# Patient Record
Sex: Female | Born: 1935 | Race: White | Hispanic: No | Smoking: Never smoker
Health system: Southern US, Community
[De-identification: ages and names within clinical notes are randomized; demographics above are authoritative.]

## PROBLEM LIST (undated history)

## (undated) DIAGNOSIS — I10 Essential (primary) hypertension: Secondary | ICD-10-CM

## (undated) DIAGNOSIS — F0390 Unspecified dementia without behavioral disturbance: Secondary | ICD-10-CM

## (undated) DIAGNOSIS — C801 Malignant (primary) neoplasm, unspecified: Secondary | ICD-10-CM

## (undated) DIAGNOSIS — E079 Disorder of thyroid, unspecified: Secondary | ICD-10-CM

## (undated) DIAGNOSIS — M1990 Unspecified osteoarthritis, unspecified site: Secondary | ICD-10-CM

## (undated) HISTORY — PX: ABDOMINAL HYSTERECTOMY: SHX81

## (undated) HISTORY — PX: CHOLECYSTECTOMY: SHX55

---

## 2001-08-24 ENCOUNTER — Ambulatory Visit (HOSPITAL_BASED_OUTPATIENT_CLINIC_OR_DEPARTMENT_OTHER): Admission: RE | Admit: 2001-08-24 | Discharge: 2001-08-24 | Payer: Self-pay | Admitting: General Surgery

## 2002-10-21 ENCOUNTER — Encounter: Payer: Self-pay | Admitting: Emergency Medicine

## 2002-10-21 ENCOUNTER — Emergency Department (HOSPITAL_COMMUNITY): Admission: EM | Admit: 2002-10-21 | Discharge: 2002-10-21 | Payer: Self-pay | Admitting: Emergency Medicine

## 2002-12-10 ENCOUNTER — Encounter: Payer: Self-pay | Admitting: General Surgery

## 2002-12-12 ENCOUNTER — Encounter: Payer: Self-pay | Admitting: General Surgery

## 2002-12-12 ENCOUNTER — Encounter (INDEPENDENT_AMBULATORY_CARE_PROVIDER_SITE_OTHER): Payer: Self-pay | Admitting: *Deleted

## 2002-12-12 ENCOUNTER — Observation Stay (HOSPITAL_COMMUNITY): Admission: RE | Admit: 2002-12-12 | Discharge: 2002-12-13 | Payer: Self-pay | Admitting: General Surgery

## 2006-05-03 ENCOUNTER — Other Ambulatory Visit: Admission: RE | Admit: 2006-05-03 | Discharge: 2006-05-03 | Payer: Self-pay | Admitting: Family Medicine

## 2006-05-19 ENCOUNTER — Encounter: Admission: RE | Admit: 2006-05-19 | Discharge: 2006-05-19 | Payer: Self-pay | Admitting: Family Medicine

## 2007-12-24 ENCOUNTER — Emergency Department (HOSPITAL_COMMUNITY): Admission: EM | Admit: 2007-12-24 | Discharge: 2007-12-24 | Payer: Self-pay | Admitting: Emergency Medicine

## 2007-12-27 ENCOUNTER — Encounter: Admission: RE | Admit: 2007-12-27 | Discharge: 2008-01-29 | Payer: Self-pay | Admitting: Family Medicine

## 2009-02-03 IMAGING — CT CT CERVICAL SPINE W/O CM
5 series · 16 of 33 positions shown, 18 images · IV contrast (agent unspecified)
Comparison: None.

CLINICAL DATA: 74 year-old fell.
 HEAD CT WITHOUT CONTRAST:
TECHNIQUE: Contiguous axial images were obtained from the base of the skull through the vertex according to standard protocol without contrast.
TECHNIQUE: Multidetector CT imaging of the cervical spine was performed.  Multiplanar CT image reconstructions were also generated.

[Series 2: cervical spine · axial · 0.27mm/px · z∈[-160,-97]mm · 2 of 77 slices shown]
[im 26/77  bone]
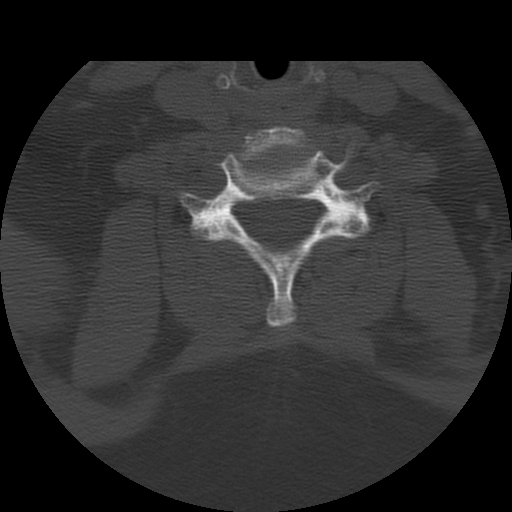
[im 51/77  bone]
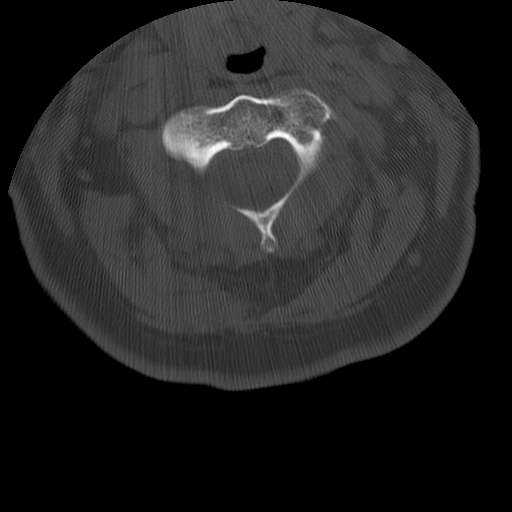

[Series 3: recon 2: cervical spine · axial · 0.27mm/px · z∈[-177,-80]mm · 3 of 77 slices shown, 4 images]
[im 20/77  soft-tissue]
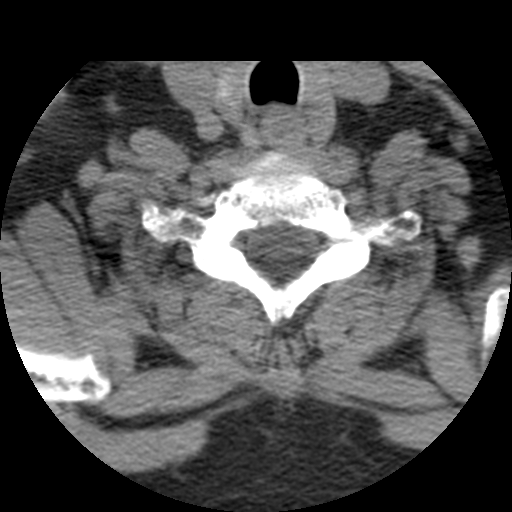
[im 20/77  bone]
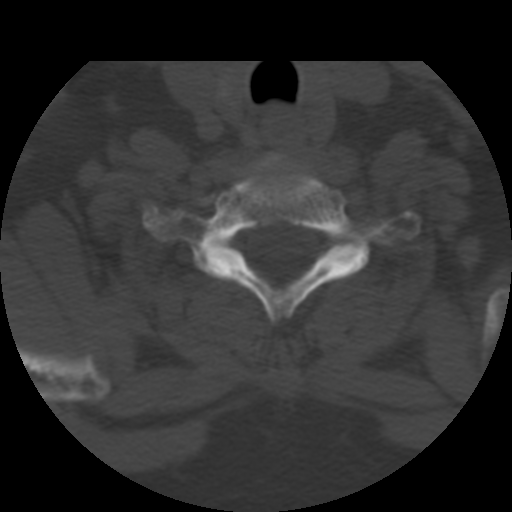
[im 39/77  bone]
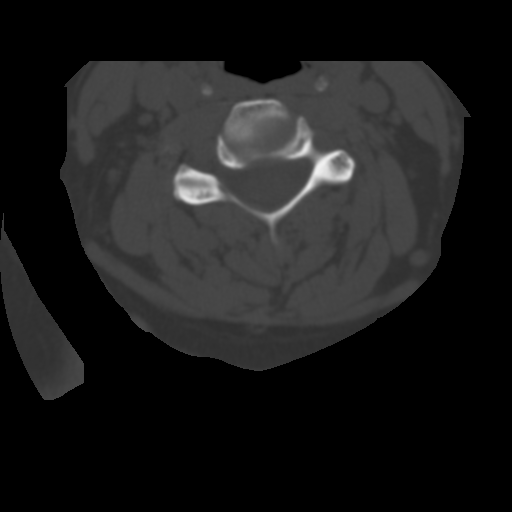
[im 58/77  bone]
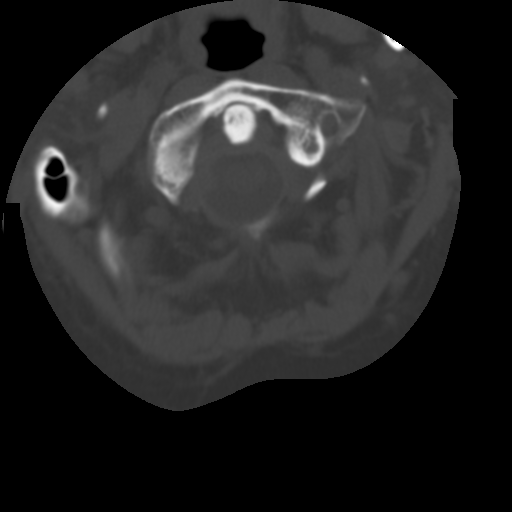

[Series 400: cor c-spine · coronal · 0.37mm/px · 3 of 42 slices shown]
[im 9/42  bone]
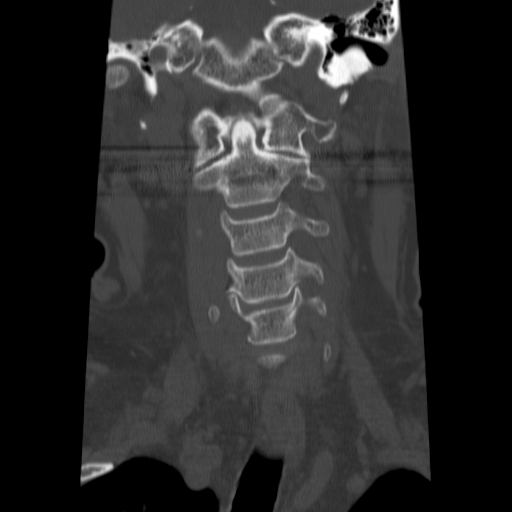
[im 17/42  bone]
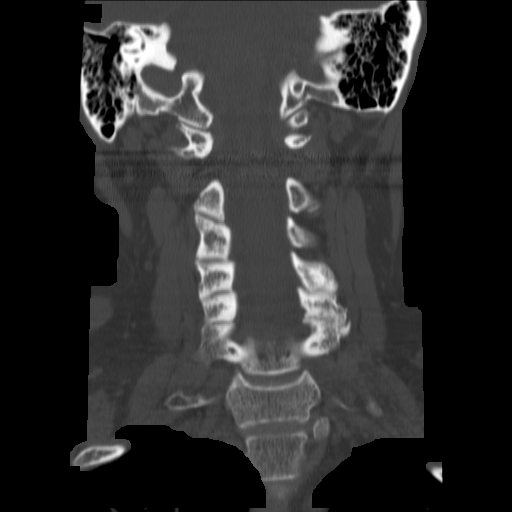
[im 25/42  bone]
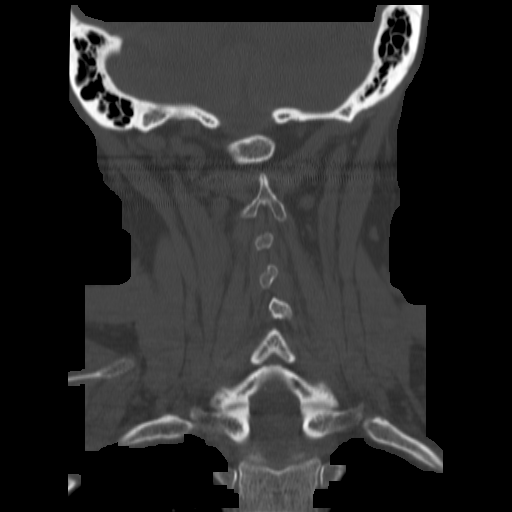

[Series 401: true axials c-spine · axial · 0.37mm/px · z∈[-199,-121]mm · 3 of 82 slices shown]
[im 21/82  bone]
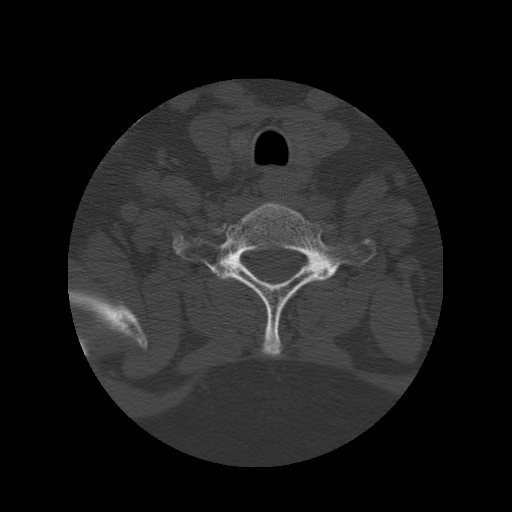
[im 41/82  bone]
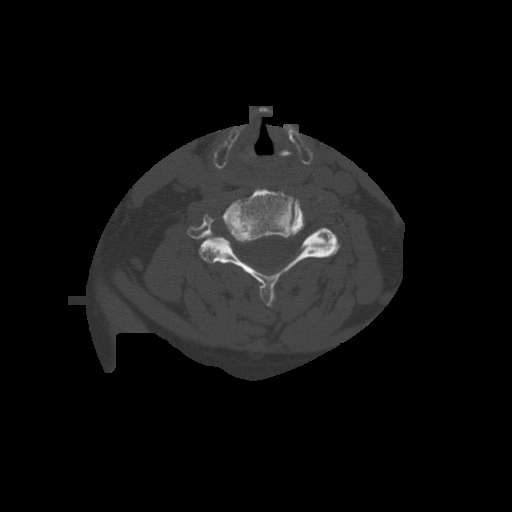
[im 61/82  bone]
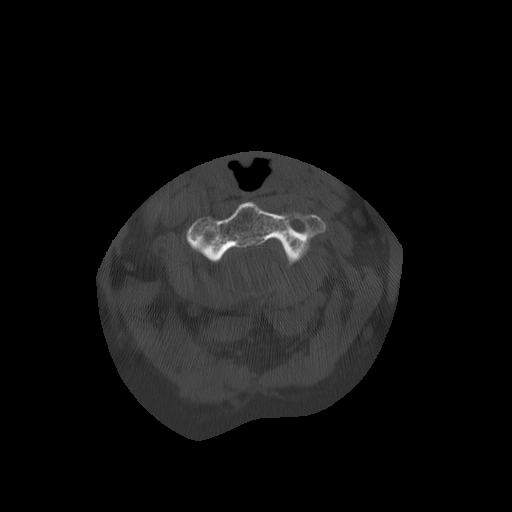

[Series 402: sag c-spine · sagittal · 0.37mm/px · 5 of 41 slices shown, 6 images]
[im 14/41  bone]
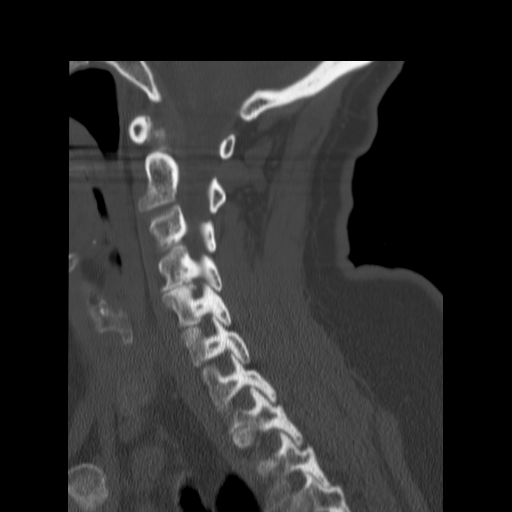
[im 17/41  bone]
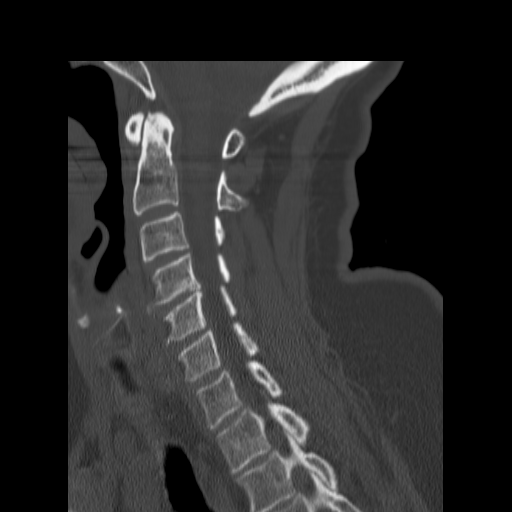
[im 21/41  soft-tissue]
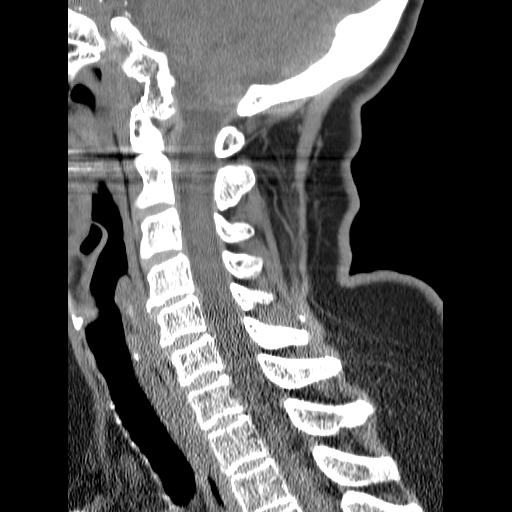
[im 21/41  bone]
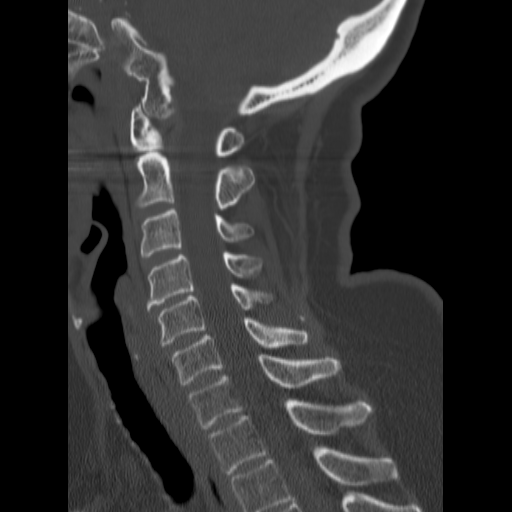
[im 24/41  bone]
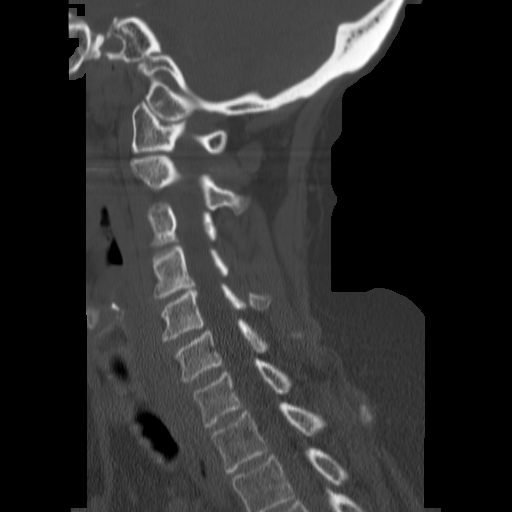
[im 27/41  bone]
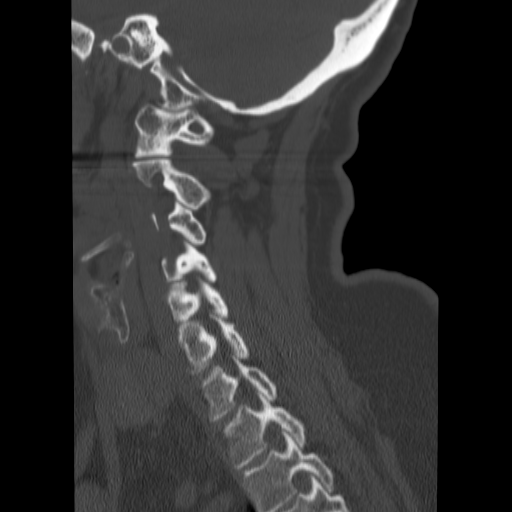

[16 of 33 positions shown; findings below may reference images not displayed]

FINDINGS: The ventricles are in the midline without mass effect or shift.  They are normal in size and configuration.  No extraaxial fluid collections are seen. No acute intracranial findings and no intracranial mass lesions.
 There are a few tiny benign appearing bilateral basal ganglia calcifications and tiny remote lacunar infarct on the left in the basal ganglia. No significant periventricular white matter disease. The brainstem and cerebellum are grossly normal. The bony calvarium is intact.  No skull fracture. There is a left high parietal scalp hematoma.  The paranasal sinuses and mastoid air cells are clear.
IMPRESSION: 1.  No CT evidence for acute intracranial abnormality and no intracranial mass lesions.
 2.  Left high parietal scalp hematoma but no underlying skull fracture.
 CERVICAL SPINE CT WITHOUT CONTRAST:
FINDINGS: The sagittal reformatted images demonstrate normal alignment of the cervical vertebral bodies.  Mild degenerative cervical spondylosis with degenerative disk disease and degenerative facet disease.  No definite fractures are seen.  No abnormal prevertebral soft tissue swelling. No facet or laminar fracture.
 The skull base, C-1, and C1-C2 articulations are normal. The dens appears normal. No evidence for acute disk herniation.  There is mild multilevel foraminal narrowing due to uncinate spurring and facet disease. No epidural hematoma.
IMPRESSION: 1.  Normal alignment. No acute bony findings.
 2.  Degenerative cervical spondylosis with degenerative disease and degenerative facet disease.

## 2009-12-18 ENCOUNTER — Emergency Department (HOSPITAL_COMMUNITY): Admission: EM | Admit: 2009-12-18 | Discharge: 2009-12-18 | Payer: Self-pay | Admitting: Emergency Medicine

## 2010-10-12 ENCOUNTER — Emergency Department (HOSPITAL_COMMUNITY)
Admission: EM | Admit: 2010-10-12 | Discharge: 2010-10-12 | Payer: Self-pay | Source: Home / Self Care | Admitting: Emergency Medicine

## 2010-11-21 ENCOUNTER — Encounter: Payer: Self-pay | Admitting: Emergency Medicine

## 2011-01-11 LAB — DIFFERENTIAL
Basophils Absolute: 0 10*3/uL (ref 0.0–0.1)
Eosinophils Relative: 3 % (ref 0–5)
Lymphs Abs: 2.2 10*3/uL (ref 0.7–4.0)
Monocytes Absolute: 0.5 10*3/uL (ref 0.1–1.0)
Neutro Abs: 3.9 10*3/uL (ref 1.7–7.7)
Neutrophils Relative %: 58 % (ref 43–77)

## 2011-01-11 LAB — BASIC METABOLIC PANEL
BUN: 13 mg/dL (ref 6–23)
CO2: 26 mEq/L (ref 19–32)
GFR calc Af Amer: >60 mL/min (ref >60)
Glucose, Bld: 94 mg/dL (ref 70–99)
Potassium: 3.4 mEq/L — ABNORMAL LOW (ref 3.5–5.1)
Sodium: 141 mEq/L (ref 135–145)

## 2011-01-11 LAB — CBC
HCT: 38.7 % (ref 36.0–46.0)
Hemoglobin: 13.1 g/dL (ref 12.0–15.0)
MCV: 94.9 fL (ref 78.0–100.0)
Platelets: 252 10*3/uL (ref 150–400)
RBC: 4.08 MIL/uL (ref 3.87–5.11)
RDW: 13.2 % (ref 11.5–15.5)
WBC: 6.7 10*3/uL (ref 4.0–10.5)

## 2011-01-20 LAB — COMPREHENSIVE METABOLIC PANEL
ALT: 28 U/L (ref 0–35)
Albumin: 4.2 g/dL (ref 3.5–5.2)
BUN: 15 mg/dL (ref 6–23)
CO2: 23 mEq/L (ref 19–32)
Chloride: 107 mEq/L (ref 96–112)
Glucose, Bld: 106 mg/dL — ABNORMAL HIGH (ref 70–99)

## 2011-01-20 LAB — POCT CARDIAC MARKERS
CKMB, poc: 1.1 ng/mL (ref 1.0–8.0)
Troponin i, poc: <0.05 ng/mL (ref 0.00–0.09)

## 2011-03-08 ENCOUNTER — Other Ambulatory Visit: Payer: Self-pay | Admitting: Dermatology

## 2011-03-19 NOTE — Op Note (Signed)
[name], [name]                       ACCOUNT NO.:  [id number]   MEDICAL RECORD NO.:  [id number]                   PATIENT TYPE:  AMB   LOCATION:  DAY                                  FACILITY:  [facility]   PHYSICIAN:  Angelia Mould. Derrell Lolling, M.D.             DATE OF BIRTH:  Sep 05, 1933   DATE OF PROCEDURE:  12/12/2002  DATE OF DISCHARGE:                                 OPERATIVE REPORT   PREOPERATIVE DIAGNOSIS:  Chronic cholecystitis with cholelithiasis.   POSTOPERATIVE DIAGNOSIS:  Severe chronic cholecystitis with cholelithiasis.   PROCEDURE:  Laparoscopic cholecystectomy with intraoperative cholangiogram.   SURGEON:  Angelia Mould. Derrell Lolling, M.D.   ASSISTANT:  Anselm Pancoast. Zachery Dakins, M.D.   OPERATIVE INDICATION:  This is a 77 year old white female who has had an  abdominal pain syndrome for three or four months.  This started out in the  left lower quadrant, and she had a CT scan and colonoscopy which was all  basically normal.  The pain became more epigastric and postprandial in  nature, and she had a gallbladder ultrasound which showed multiple  gallstones which almost completely filled the gallbladder.  Her white blood  cell count is normal.  Her liver function tests are normal.  She is brought  to the operating room electively.   OPERATIVE FINDINGS:  The gallbladder was severely inflamed.  It was markedly  thick-walled with significant chronic inflammation and the appearance  consistent with acute inflammation.  The common bile duct was high-riding  and had to be carefully dissected off of the infundibulum of the  gallbladder.  The anatomy of the cystic duct, common bile duct, and common  hepatic duct were conventional.  The cholangiogram was normal, showing  normal intrahepatic and extrahepatic bile ducts, prompt flow of contrast  into the duodenum, and no filling defect.  The liver appeared healthy.  The  stomach and duodenum, large intestine and small intestine were  visually  normal.   DESCRIPTION OF PROCEDURE:  Following the induction of general endotracheal  anesthesia, the patient's abdomen was prepped and draped in a sterile  fashion.  Marcaine 0.5% with epinephrine was used as a local infiltration  anesthetic.  A vertically-oriented incision was made inside the lower rim of  the umbilicus.  The fascia was incised in the midline, the abdominal cavity  entered under direct vision.  A 10 mm Hasson trocar was then inserted and  secured with a pursestring suture of 0 Vicryl.  Pneumoperitoneum was  created.  The video camera was inserted with visualization and findings as  described above.  A 10 mm trocar was placed in the subxiphoid region and two  5 mm trocars placed in the right midabdomen.   We placed the camera in the epigastric port and looked at the umbilical port  and found that there were a few omental adhesions around the umbilicus but  no intestinal adhesions.   The gallbladder fundus was identified  and grasped.  It was very thick-walled  and difficult to grasp.  The chronic and somewhat acute adhesions were  carefully teased off of the gallbladder until we could dissect down and  identify the lower body of the gallbladder and something of the infundibulum  of the gallbladder.  We very carefully dissected the duodenum away from the  lower gallbladder.  There was no fistula or inflammation of the duodenum.   We had to spend some time dissecting what appeared to be a somewhat dilated  common bile duct and common hepatic duct away from the lower body of the  gallbladder, but we ultimately were able to dissect this away and get around  the cystic duct.  A metal clip was placed on the cystic duct close to the  gallbladder.  A cholangiogram catheter was inserted into the cystic duct.  A  cholangiogram was obtained using a C-arm.  We visualized normal intrahepatic  and normal extrahepatic bile ducts, no filling defects, and prompt flow of   contrast into the duodenum.  The cholangiogram catheter was removed and the  cystic duct was secured with multiple metal clips and divided.  The  gallbladder was carefully dissected off from its bed.  The cystic artery was  isolated with metal clips and divided as it went onto the gallbladder wall.  We could very carefully continue the dissection until we had removed the  gallbladder.  Some turbid bile was spilled and was suctioned away.  The  gallbladder was placed in a specimen bag and removed through the umbilical  port.   The operative field was copiously irrigated with probably as much as 3 L of  saline during the case.  At the completion of the case there was no bleeding  and no bile leak whatsoever, and the irrigation fluid was completely clear.  The trocars were removed under direct vision, and there was no bleeding from  the trocar sites.  The pneumoperitoneum was released.  The fascia at the  umbilicus was closed with 0 Vicryl sutures.  Skin incisions were closed with  subcuticular sutures of 4-0 Vicryl and Steri-Strips.  Clean bandages were  placed and the patient taken to the recovery room in stable condition.  Estimated blood loss was about 30-40 mL.  Complications:  None.  Sponge,  needle, and instrument counts were correct.                                               Angelia Mould. Derrell Lolling, M.D.    HMI/MEDQ  D:  12/12/2002  T:  12/12/2002  Job:  [id number]   cc:   Dellis Anes. Idell Pickles, M.D.  [address]  [place]  [place] [address]  Fax: [phone]   Llana Aliment. Malon Kindle., M.D.  [address]  [place]  [place] [address]  Fax: [phone]

## 2011-06-15 ENCOUNTER — Other Ambulatory Visit: Payer: Self-pay | Admitting: Family Medicine

## 2011-06-15 DIAGNOSIS — R296 Repeated falls: Secondary | ICD-10-CM

## 2011-07-06 ENCOUNTER — Ambulatory Visit
Admission: RE | Admit: 2011-07-06 | Discharge: 2011-07-06 | Disposition: A | Discharge status: Home / Self Care | Payer: Medicare Other | Source: Ambulatory Visit | Attending: Family Medicine | Admitting: Family Medicine

## 2011-07-06 ENCOUNTER — Other Ambulatory Visit: Payer: Self-pay

## 2011-07-06 DIAGNOSIS — R296 Repeated falls: Secondary | ICD-10-CM

## 2011-07-26 LAB — CBC
HCT: 40
MCHC: 33.8
MCV: 93.4
Platelets: 250
RBC: 4.28
RDW: 12.9

## 2011-07-26 LAB — BASIC METABOLIC PANEL
CO2: 25
Chloride: 105
Creatinine, Ser: 0.74

## 2011-11-23 IMAGING — CR DG HIP COMPLETE 2+V*R*
3 series · 3 of 3 positions shown · non-contrast
Comparison: None

CLINICAL DATA: Fell.  Right hip pain.

RIGHT HIP - COMPLETE 2+ VIEW

[t pelvis a.p.]
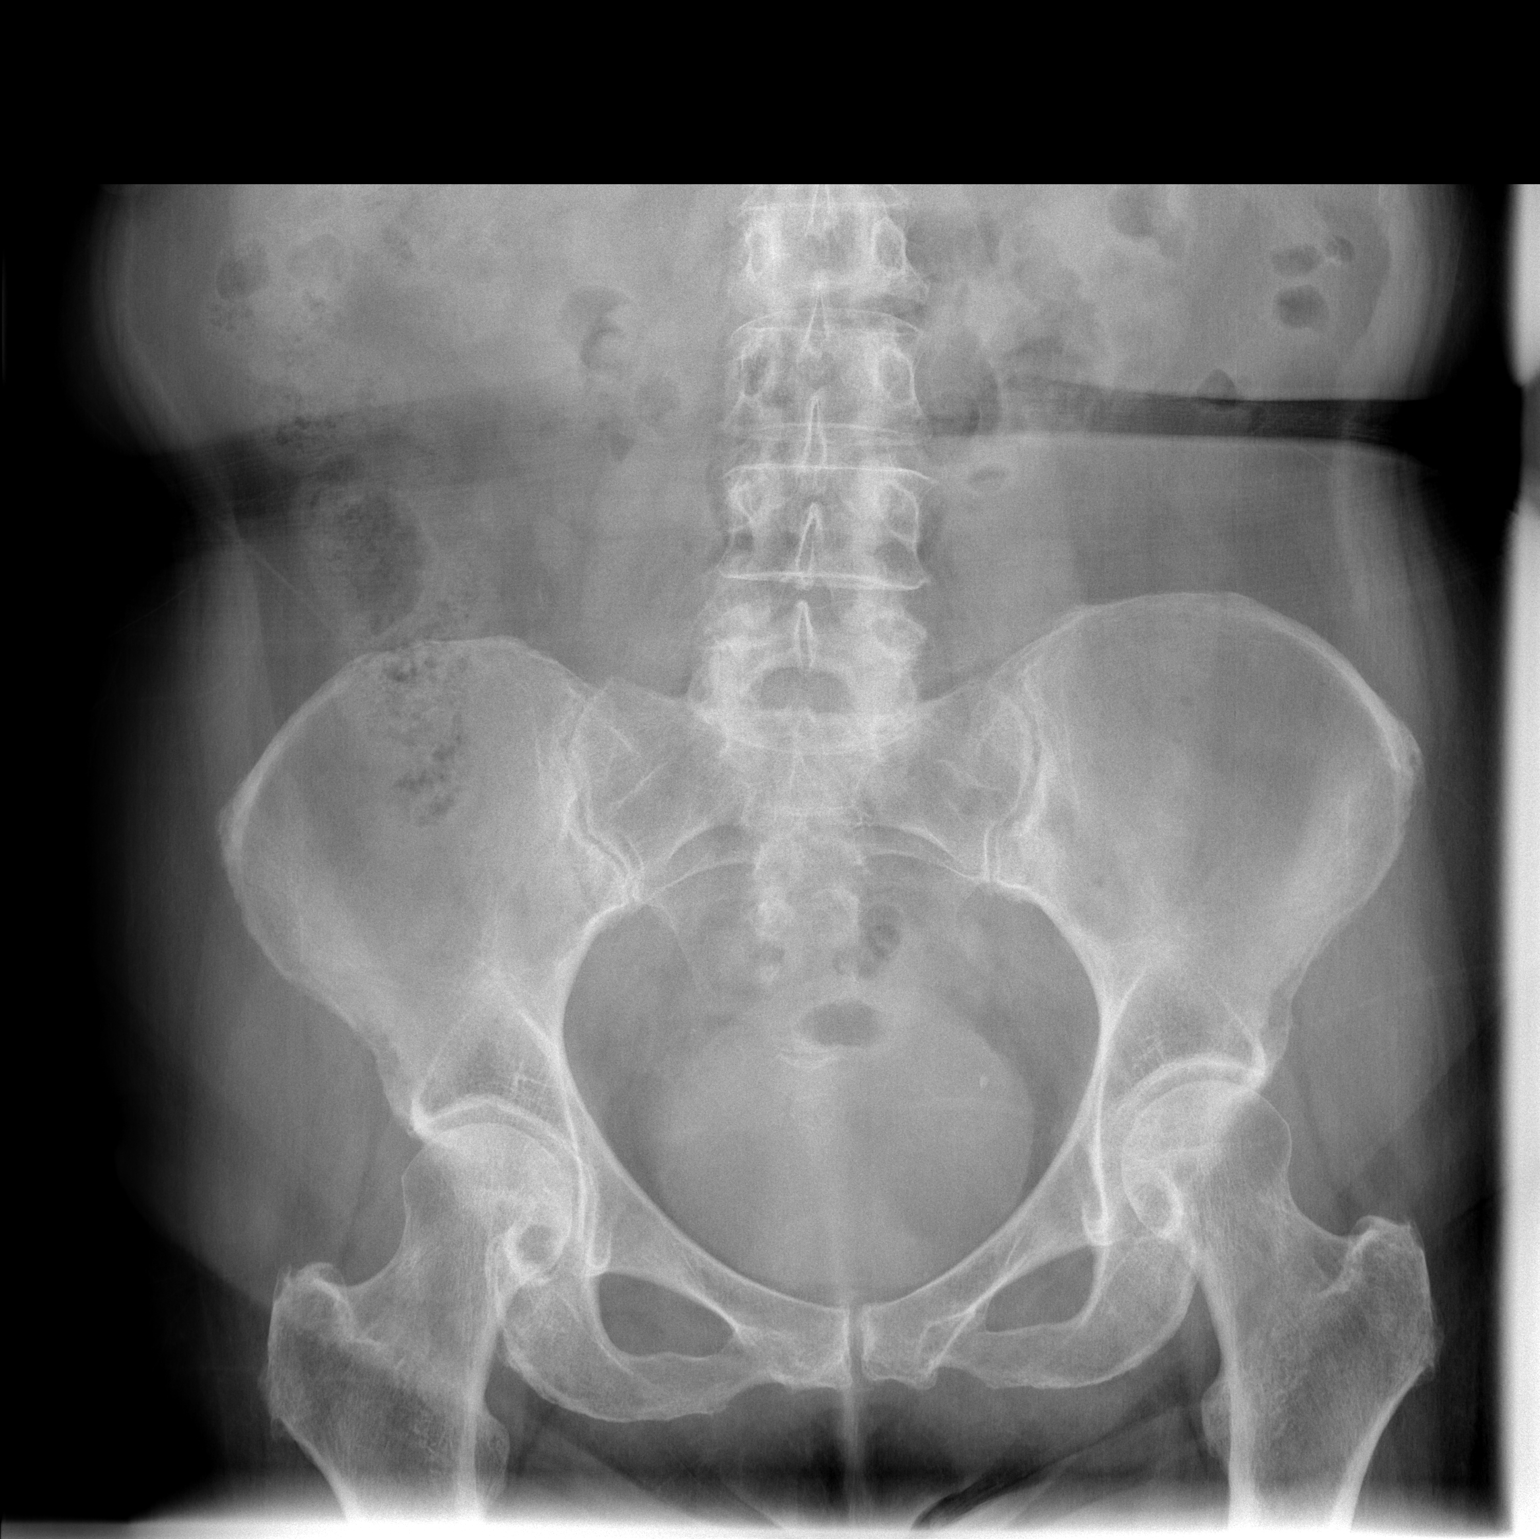

[t hip ap right]
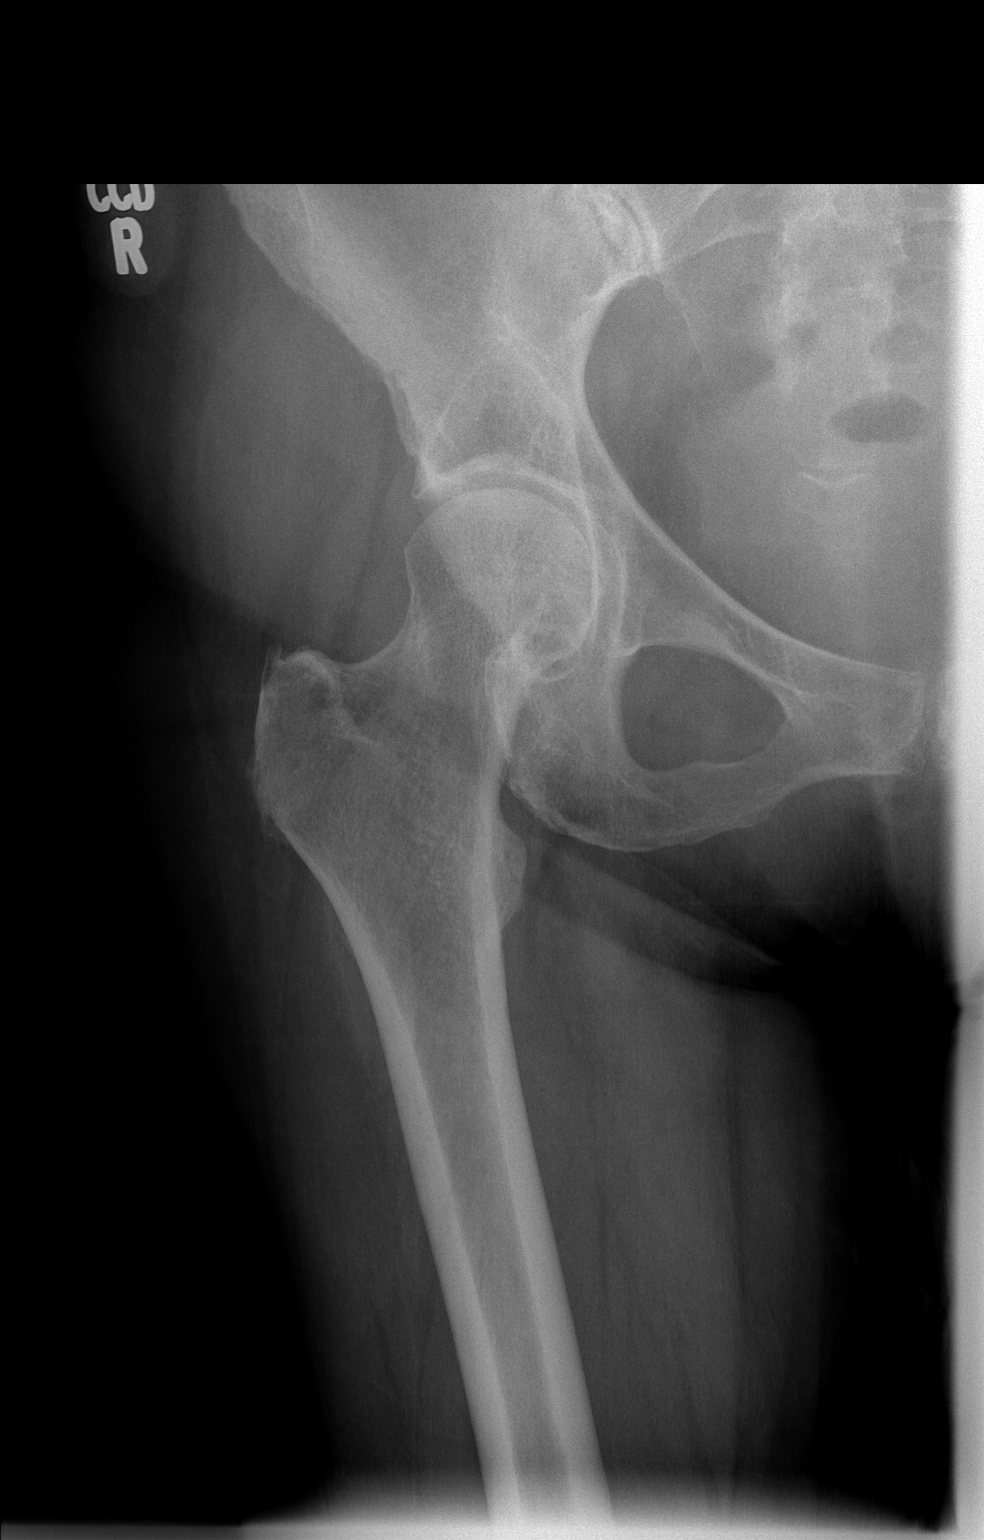

[t hip frog leg right]
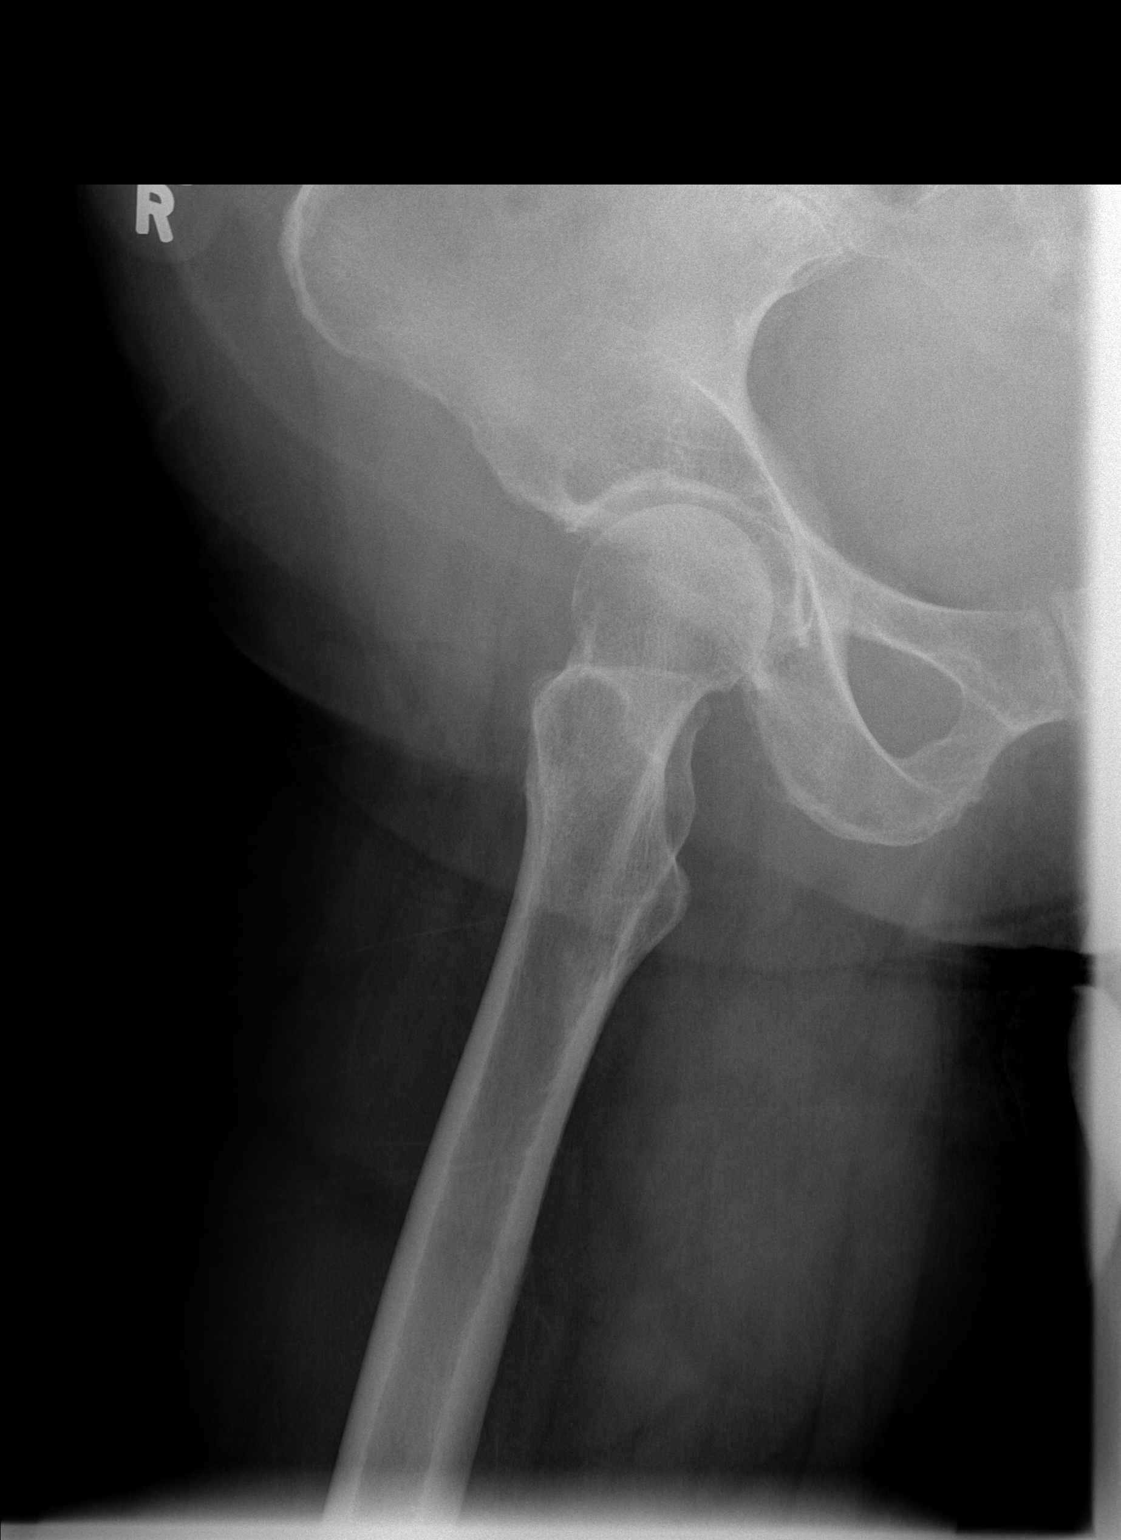

[3 of 3 positions shown; findings below may reference images not displayed]

FINDINGS: The pubic symphysis and SI joints are intact.  No hip
fracture.
IMPRESSION: No acute bony findings.

## 2012-08-31 ENCOUNTER — Other Ambulatory Visit: Payer: Self-pay | Admitting: Dermatology

## 2012-12-31 ENCOUNTER — Encounter (HOSPITAL_BASED_OUTPATIENT_CLINIC_OR_DEPARTMENT_OTHER): Payer: Self-pay | Admitting: *Deleted

## 2012-12-31 ENCOUNTER — Emergency Department (HOSPITAL_BASED_OUTPATIENT_CLINIC_OR_DEPARTMENT_OTHER)
Admission: EM | Admit: 2012-12-31 | Discharge: 2012-12-31 | Disposition: A | Discharge status: Home / Self Care | Payer: Medicare Other | Attending: Emergency Medicine | Admitting: Emergency Medicine

## 2012-12-31 ENCOUNTER — Emergency Department (HOSPITAL_BASED_OUTPATIENT_CLINIC_OR_DEPARTMENT_OTHER): Payer: Medicare Other

## 2012-12-31 DIAGNOSIS — Y939 Activity, unspecified: Secondary | ICD-10-CM | POA: Insufficient documentation

## 2012-12-31 DIAGNOSIS — W1809XA Striking against other object with subsequent fall, initial encounter: Secondary | ICD-10-CM | POA: Insufficient documentation

## 2012-12-31 DIAGNOSIS — S0100XA Unspecified open wound of scalp, initial encounter: Secondary | ICD-10-CM | POA: Insufficient documentation

## 2012-12-31 DIAGNOSIS — S0990XA Unspecified injury of head, initial encounter: Secondary | ICD-10-CM | POA: Insufficient documentation

## 2012-12-31 DIAGNOSIS — S0101XA Laceration without foreign body of scalp, initial encounter: Secondary | ICD-10-CM

## 2012-12-31 DIAGNOSIS — Y9289 Other specified places as the place of occurrence of the external cause: Secondary | ICD-10-CM | POA: Insufficient documentation

## 2012-12-31 DIAGNOSIS — Z85828 Personal history of other malignant neoplasm of skin: Secondary | ICD-10-CM | POA: Insufficient documentation

## 2012-12-31 DIAGNOSIS — IMO0002 Reserved for concepts with insufficient information to code with codable children: Secondary | ICD-10-CM | POA: Insufficient documentation

## 2012-12-31 DIAGNOSIS — W19XXXA Unspecified fall, initial encounter: Secondary | ICD-10-CM

## 2012-12-31 DIAGNOSIS — I10 Essential (primary) hypertension: Secondary | ICD-10-CM | POA: Insufficient documentation

## 2012-12-31 HISTORY — DX: Malignant (primary) neoplasm, unspecified: C80.1

## 2012-12-31 HISTORY — DX: Essential (primary) hypertension: I10

## 2012-12-31 NOTE — ED Notes (Signed)
I cleansed hair from dried blood per patient request. I applied bacitracin to surface of 4x4, completed with a large kerlix wrap.

## 2012-12-31 NOTE — ED Notes (Signed)
Pt states she got her foot caught and fell back, hitting her buttocks, then head. Lac to head. No LOC. Bleeding controlled. Also c/o pain to buttocks. PERL.

## 2012-12-31 NOTE — ED Provider Notes (Signed)
History  This chart was scribed for Sheila B. Bernette Mayers, MD by Shari Heritage, ED Scribe. The patient was seen in room MH11/MH11. Patient's care was started at 76.   CSN: [id number]  Arrival date & time 12/31/12  1825   First MD Initiated Contact with Patient 12/31/12 1913      Chief Complaint  Patient presents with  . Fall     The history is provided by the patient. No language interpreter was used.      HPI Comments: [name] is a 79 y.o. female who presents to the Emergency Department complaining of a mild, non-radiating, dull pain to the buttocks and a laceration to the back of the head resulting from a fall that occurred 1-2 hours ago. Patient states that her foot got caught in the grass and she fell backwards onto the concrete of her patio hitting her buttocks then her head. She denies loss of consciousness, neck pain, extremity pain, chest pain or abdominal pain. No numbness, weakness or tingling. Patient has a history of skin cancer and hypertension. Patient had a skin graft to the occipital head in April 2013. Tdap is UTD. Patient does not smoke.  Past Medical History  Diagnosis Date  . Hypertension   . Cancer     Past Surgical History  Procedure Laterality Date  . Cholecystectomy    . Abdominal hysterectomy      History reviewed. No pertinent family history.  History  Substance Use Topics  . Smoking status: Never Smoker   . Smokeless tobacco: Not on file  . Alcohol Use: Yes    OB History   Grav Para Term Preterm Abortions TAB SAB Ect Mult Living                  Review of Systems A complete 10 system review of systems was obtained and all systems are negative except as noted in the HPI and PMH.   Allergies  Review of patient's allergies indicates no known allergies.  Home Medications  No current outpatient prescriptions on file.  Triage Vitals: BP 140/60  Pulse 72  Temp(Src) 97.9 F (36.6 C) (Oral)  Resp 20  Ht 5\' 2"  (1.575 m)  Wt 130  lb (58.968 kg)  BMI 23.77 kg/m2  SpO2 98%  Physical Exam  Nursing note and vitals reviewed. Constitutional: She is oriented to person, place, and time. She appears well-developed and well-nourished.  HENT:  Head: Normocephalic. Head is with laceration.  4 cm jagged linear laceration to the parieto-occipital scalp.  Eyes: EOM are normal. Pupils are equal, round, and reactive to light.  Neck: Normal range of motion. Neck supple.  Cardiovascular: Normal rate, normal heart sounds and intact distal pulses.   Pulmonary/Chest: Effort normal and breath sounds normal.  Abdominal: Bowel sounds are normal. She exhibits no distension. There is no tenderness.  Musculoskeletal: Normal range of motion. She exhibits no edema and no tenderness.       Cervical back: She exhibits no tenderness and no bony tenderness.  No midline C spine tenderness.   Neurological: She is alert and oriented to person, place, and time. She has normal strength. No cranial nerve deficit or sensory deficit.  Skin: Skin is warm and dry. No rash noted.  Psychiatric: She has a normal mood and affect.    ED Course  Procedures (including critical care time) DIAGNOSTIC STUDIES: Oxygen Saturation is 98% on room air, normal by my interpretation.    COORDINATION OF CARE: 7:20 PM- Patient informed  of current plan for treatment and evaluation and agrees with plan at this time.     Ct Head Wo Contrast  12/31/2012  *RADIOLOGY REPORT*  Clinical Data:  Status post fall.  Head injury with laceration. Neck pain.  CT HEAD WITHOUT CONTRAST CT CERVICAL SPINE WITHOUT CONTRAST  Technique:  Multidetector CT imaging of the head and cervical spine was performed following the standard protocol without intravenous contrast.  Multiplanar CT image reconstructions of the cervical spine were also generated.  Comparison:  Head CT 07/06/2011.  Cervical spine CT 12/24/2007.  CT HEAD  Findings: There is soft tissue swelling in the parietal occipital scalp.   No underlying calvarial fracture is demonstrated.  There is stable atrophy and mild chronic periventricular white matter disease.  There is no evidence of acute intracranial hemorrhage, mass lesion, brain edema or extra-axial fluid collection.  There is no evidence of acute cortical infarct.  The visualized paranasal sinuses are clear.  IMPRESSION:  1.  Parietal-occipital scalp injury. 2.  No acute intracranial or calvarial findings.  CT CERVICAL SPINE  Findings: The cervical alignment is normal.  There is no evidence of acute fracture or traumatic subluxation.  There is stable disc space loss with posterior osteophytes at C4-C5.  Facet disease is most advanced on the left at C5-C6.  No acute soft tissue findings are demonstrated.  IMPRESSION:  1.  No evidence of acute cervical spine fracture, traumatic subluxation or static signs of instability. 2.  Stable spondylosis.   Original Report Authenticated By: Carey Bullocks, M.D.    Ct Cervical Spine Wo Contrast  12/31/2012  *RADIOLOGY REPORT*  Clinical Data:  Status post fall.  Head injury with laceration. Neck pain.  CT HEAD WITHOUT CONTRAST CT CERVICAL SPINE WITHOUT CONTRAST  Technique:  Multidetector CT imaging of the head and cervical spine was performed following the standard protocol without intravenous contrast.  Multiplanar CT image reconstructions of the cervical spine were also generated.  Comparison:  Head CT 07/06/2011.  Cervical spine CT 12/24/2007.  CT HEAD  Findings: There is soft tissue swelling in the parietal occipital scalp.  No underlying calvarial fracture is demonstrated.  There is stable atrophy and mild chronic periventricular white matter disease.  There is no evidence of acute intracranial hemorrhage, mass lesion, brain edema or extra-axial fluid collection.  There is no evidence of acute cortical infarct.  The visualized paranasal sinuses are clear.  IMPRESSION:  1.  Parietal-occipital scalp injury. 2.  No acute intracranial or calvarial  findings.  CT CERVICAL SPINE  Findings: The cervical alignment is normal.  There is no evidence of acute fracture or traumatic subluxation.  There is stable disc space loss with posterior osteophytes at C4-C5.  Facet disease is most advanced on the left at C5-C6.  No acute soft tissue findings are demonstrated.  IMPRESSION:  1.  No evidence of acute cervical spine fracture, traumatic subluxation or static signs of instability. 2.  Stable spondylosis.   Original Report Authenticated By: Carey Bullocks, M.D.      No diagnosis found.    MDM  Pt apparently began complaining of neck pain while in CT, but did not have any tenderness on my exam. CT added and neg.   LACERATION REPAIR Performed by: Pollyann Savoy. Consent: Verbal consent obtained. Risks and benefits: risks, benefits and alternatives were discussed Patient identity confirmed: provided demographic data Time out performed prior to procedure Prepped and Draped in normal sterile fashion Wound explored Laceration Location: scalp Laceration Length: 4cm No Foreign  Bodies seen or palpated Anesthesia: local infiltration Local anesthetic: lidocaine 2% no epinephrine Anesthetic total: 3 ml Irrigation method: syringe Amount of cleaning: standard Skin closure: staples Number of sutures or staples: 5 Technique: staples Patient tolerance: Patient tolerated the procedure well with no immediate complications.       I personally performed the services described in this documentation, which was scribed in my presence. The recorded information has been reviewed and is accurate.     Sheila B. Bernette Mayers, MD 12/31/12 2113

## 2013-01-06 ENCOUNTER — Emergency Department (HOSPITAL_BASED_OUTPATIENT_CLINIC_OR_DEPARTMENT_OTHER)
Admission: EM | Admit: 2013-01-06 | Discharge: 2013-01-06 | Disposition: A | Discharge status: Home / Self Care | Payer: Medicare Other | Attending: Emergency Medicine | Admitting: Emergency Medicine

## 2013-01-06 ENCOUNTER — Encounter (HOSPITAL_BASED_OUTPATIENT_CLINIC_OR_DEPARTMENT_OTHER): Payer: Self-pay | Admitting: *Deleted

## 2013-01-06 DIAGNOSIS — I10 Essential (primary) hypertension: Secondary | ICD-10-CM | POA: Insufficient documentation

## 2013-01-06 DIAGNOSIS — Z4802 Encounter for removal of sutures: Secondary | ICD-10-CM | POA: Insufficient documentation

## 2013-01-06 DIAGNOSIS — Z79899 Other long term (current) drug therapy: Secondary | ICD-10-CM | POA: Insufficient documentation

## 2013-01-06 DIAGNOSIS — Z859 Personal history of malignant neoplasm, unspecified: Secondary | ICD-10-CM | POA: Insufficient documentation

## 2013-01-06 NOTE — ED Provider Notes (Signed)
History     CSN: [id number]  Arrival date & time 01/06/13  1508   First MD Initiated Contact with Patient 01/06/13 1632      Chief Complaint  Patient presents with  . Suture / Staple Removal    (Consider location/radiation/quality/duration/timing/severity/associated sxs/prior treatment) HPI Patient resists emergency department for staple removal of the scalp laceration that occurred 1 week ago.  Patient, states she's not had any signs of infection and no worsening of the area. Past Medical History  Diagnosis Date  . Hypertension   . Cancer     Past Surgical History  Procedure Laterality Date  . Cholecystectomy    . Abdominal hysterectomy      No family history on file.  History  Substance Use Topics  . Smoking status: Never Smoker   . Smokeless tobacco: Not on file  . Alcohol Use: Yes    OB History   Grav Para Term Preterm Abortions TAB SAB Ect Mult Living                  Review of Systems All other systems negative except as documented in the HPI. All pertinent positives and negatives as reviewed in the HPI. Allergies  Review of patient's allergies indicates no known allergies.  Home Medications   Current Outpatient Rx  Name  Route  Sig  Dispense  Refill  . Levothyroxine Sodium (LEVOTHROID PO)   Oral   Take by mouth.         Marland Kitchen RAMIPRIL PO   Oral   Take by mouth.         . Sertraline HCl (ZOLOFT PO)   Oral   Take by mouth.           BP 138/56  Pulse 65  Temp(Src) 98.4 F (36.9 C) (Oral)  Resp 18  Ht 5\' 1"  (1.549 m)  Wt 140 lb (63.504 kg)  BMI 26.47 kg/m2  SpO2 99%  Physical Exam  Nursing note and vitals reviewed. Constitutional: She is oriented to person, place, and time. She appears well-developed and well-nourished. No distress.  HENT:  Head: Normocephalic.  Patient has 5 staples and healing laceration to the occipital portion of her scalp  Eyes: Pupils are equal, round, and reactive to light.  Pulmonary/Chest: Effort normal.   Neurological: She is alert and oriented to person, place, and time.    ED Course  Procedures (including critical care time)  5 staples removed from the laceration by the nurse.  No complications.    MDM          Carlyle Dolly, PA-C 01/06/13 1718

## 2013-01-06 NOTE — ED Notes (Signed)
Staple removal from scalp

## 2013-01-06 NOTE — ED Provider Notes (Signed)
History/physical exam/procedure(s) were performed by non-physician practitioner and as supervising physician I was immediately available for consultation/collaboration. I have reviewed all notes and am in agreement with care and plan.   Hilario Quarry, MD 01/06/13 [phone]

## 2013-01-06 NOTE — ED Notes (Signed)
Staples removed x 5 to back of head.  Dressing applied.

## 2013-03-01 ENCOUNTER — Other Ambulatory Visit: Payer: Self-pay | Admitting: Dermatology

## 2013-09-11 ENCOUNTER — Other Ambulatory Visit: Payer: Self-pay | Admitting: Dermatology

## 2013-10-15 ENCOUNTER — Emergency Department (HOSPITAL_BASED_OUTPATIENT_CLINIC_OR_DEPARTMENT_OTHER)
Admission: EM | Admit: 2013-10-15 | Discharge: 2013-10-15 | Disposition: A | Discharge status: Home / Self Care | Payer: Medicare Other | Attending: Emergency Medicine | Admitting: Emergency Medicine

## 2013-10-15 ENCOUNTER — Emergency Department (HOSPITAL_BASED_OUTPATIENT_CLINIC_OR_DEPARTMENT_OTHER): Payer: Medicare Other

## 2013-10-15 ENCOUNTER — Encounter (HOSPITAL_BASED_OUTPATIENT_CLINIC_OR_DEPARTMENT_OTHER): Payer: Self-pay | Admitting: Emergency Medicine

## 2013-10-15 DIAGNOSIS — W08XXXA Fall from other furniture, initial encounter: Secondary | ICD-10-CM | POA: Insufficient documentation

## 2013-10-15 DIAGNOSIS — S0990XA Unspecified injury of head, initial encounter: Secondary | ICD-10-CM | POA: Insufficient documentation

## 2013-10-15 DIAGNOSIS — R197 Diarrhea, unspecified: Secondary | ICD-10-CM | POA: Insufficient documentation

## 2013-10-15 DIAGNOSIS — G309 Alzheimer's disease, unspecified: Secondary | ICD-10-CM | POA: Insufficient documentation

## 2013-10-15 DIAGNOSIS — W19XXXA Unspecified fall, initial encounter: Secondary | ICD-10-CM

## 2013-10-15 DIAGNOSIS — I10 Essential (primary) hypertension: Secondary | ICD-10-CM | POA: Insufficient documentation

## 2013-10-15 DIAGNOSIS — G20A1 Parkinson's disease without dyskinesia, without mention of fluctuations: Secondary | ICD-10-CM | POA: Insufficient documentation

## 2013-10-15 DIAGNOSIS — Y9210 Unspecified residential institution as the place of occurrence of the external cause: Secondary | ICD-10-CM | POA: Insufficient documentation

## 2013-10-15 DIAGNOSIS — Z859 Personal history of malignant neoplasm, unspecified: Secondary | ICD-10-CM | POA: Insufficient documentation

## 2013-10-15 DIAGNOSIS — Y9389 Activity, other specified: Secondary | ICD-10-CM | POA: Insufficient documentation

## 2013-10-15 DIAGNOSIS — Z79899 Other long term (current) drug therapy: Secondary | ICD-10-CM | POA: Insufficient documentation

## 2013-10-15 DIAGNOSIS — F0280 Dementia in other diseases classified elsewhere without behavioral disturbance: Secondary | ICD-10-CM | POA: Insufficient documentation

## 2013-10-15 DIAGNOSIS — G20 Parkinson's disease: Secondary | ICD-10-CM | POA: Insufficient documentation

## 2013-10-15 HISTORY — DX: Unspecified osteoarthritis, unspecified site: M19.90

## 2013-10-15 HISTORY — DX: Disorder of thyroid, unspecified: E07.9

## 2013-10-15 HISTORY — DX: Unspecified dementia, unspecified severity, without behavioral disturbance, psychotic disturbance, mood disturbance, and anxiety: F03.90

## 2013-10-15 LAB — CBC WITH DIFFERENTIAL/PLATELET
Basophils Absolute: 0 10*3/uL (ref 0.0–0.1)
Eosinophils Relative: 2 % (ref 0–5)
HCT: 39.8 % (ref 36.0–46.0)
Lymphocytes Relative: 16 % (ref 12–46)
Lymphs Abs: 1.4 10*3/uL (ref 0.7–4.0)
MCH: 30.6 pg (ref 26.0–34.0)
MCHC: 33.4 g/dL (ref 30.0–36.0)
MCV: 91.5 fL (ref 78.0–100.0)
Monocytes Absolute: 0.6 10*3/uL (ref 0.1–1.0)
Monocytes Relative: 8 % (ref 3–12)
Neutro Abs: 6.2 10*3/uL (ref 1.7–7.7)
Neutrophils Relative %: 74 % (ref 43–77)
Platelets: 237 10*3/uL (ref 150–400)
RDW: 13.2 % (ref 11.5–15.5)

## 2013-10-15 LAB — COMPREHENSIVE METABOLIC PANEL
Alkaline Phosphatase: 58 U/L (ref 39–117)
Chloride: 106 mEq/L (ref 96–112)
GFR calc non Af Amer: 81 mL/min — ABNORMAL LOW (ref >90)
Glucose, Bld: 85 mg/dL (ref 70–99)
Potassium: 3.8 mEq/L (ref 3.5–5.1)

## 2013-10-15 LAB — URINALYSIS, ROUTINE W REFLEX MICROSCOPIC
Bilirubin Urine: NEGATIVE
Glucose, UA: NEGATIVE mg/dL
Hgb urine dipstick: NEGATIVE
Ketones, ur: 15 mg/dL — AB
Protein, ur: NEGATIVE mg/dL

## 2013-10-15 NOTE — ED Notes (Signed)
Pt returned from CT. Family at bedside

## 2013-10-15 NOTE — ED Provider Notes (Signed)
CSN: [id number]     Arrival date & time 10/15/13  1241 History  This chart was scribed for Geoffery Lyons, MD by Clydene Laming, ED Scribe. This patient was seen in room MH06/MH06 and the patient's care was started at 1:00 PM.    The history is provided by the patient and a relative. No language interpreter was used.   HPI Comments: [name] is a 79 y.o. female who presents to the Emergency Department complaining of a fall today without head trauma or loc. Pt states she got out of bed and sat on a nearby sofa. She moved forward to go out in the rest of the apt and fell forward. She did not hit her head or lose consciousness.  She then went back to sit on sofa. Her daughter is present and states she seems disoriented. Pt denies any back, chest or abd pain.  She has a hx of falls and usually falls to the left. Pt had diarrhea about a week ago. Pt is present with slurred speech. Her daughter states she has good and bay days of mobility and pt uses a walker. Pt has a hx of alzheimer's and Parkinson's disease .   Past Medical History  Diagnosis Date  . Hypertension   . Cancer    Past Surgical History  Procedure Laterality Date  . Cholecystectomy    . Abdominal hysterectomy     No family history on file. History  Substance Use Topics  . Smoking status: Never Smoker   . Smokeless tobacco: Not on file  . Alcohol Use: Yes   OB History   Grav Para Term Preterm Abortions TAB SAB Ect Mult Living                 Review of Systems  Neurological: Negative for syncope and headaches.    Allergies  Review of patient's allergies indicates no known allergies.  Home Medications   Current Outpatient Rx  Name  Route  Sig  Dispense  Refill  . Levothyroxine Sodium (LEVOTHROID PO)   Oral   Take by mouth.         Marland Kitchen RAMIPRIL PO   Oral   Take by mouth.         . Sertraline HCl (ZOLOFT PO)   Oral   Take by mouth.          There were no vitals taken for this visit. Physical Exam   Nursing note and vitals reviewed. Constitutional: She is oriented to person, place, and time. She appears well-developed and well-nourished. No distress.  Pt is an elderly female with no acute distress. She is awake and alert but is somewhat slow to respond to questions   HENT:  Head: Normocephalic and atraumatic.  Eyes: EOM are normal.  Neck: Normal range of motion.  Cardiovascular: Normal rate, regular rhythm and normal heart sounds.   Pulmonary/Chest: Effort normal and breath sounds normal.  Abdominal: Soft. She exhibits no distension. There is no tenderness.  Musculoskeletal: Normal range of motion.  Neurological: She is alert and oriented to person, place, and time. No cranial nerve deficit. She exhibits normal muscle tone.  Skin: Skin is warm and dry.  Psychiatric: She has a normal mood and affect. Judgment normal.    ED Course  Procedures (including critical care time) DIAGNOSTIC STUDIES: Oxygen Saturation is 99% on RA, normal by my interpretation.    COORDINATION OF CARE: 1:09 PM- Discussed treatment plan with pt at bedside. Pt verbalized understanding and agreement  with plan.   Labs Review Labs Reviewed - No data to display Imaging Review No results found.    MDM  No diagnosis found. Patient is a 79 year old female brought for evaluation of fall that occurred at the assisted living facility. She denies having hit her head and denies headache. According to the daughter she does seem to have somewhat slurred speech. This seems to be persisting into the ER visit. Her CT scan of the head is unremarkable laboratory studies are unremarkable as well. She has a history of Parkinson's and I suspect that is what has contributed to these falls. She will be discharged home with instructions to return if her symptoms recur and followup with her primary Dr. in the next week.    Geoffery Lyons, MD 10/15/13 [phone]

## 2013-10-15 NOTE — ED Notes (Signed)
MD at bedside. 

## 2013-10-15 NOTE — ED Notes (Signed)
Pt brought via pov ([place]) from [facility] for fall. Daughter sts pt has had multiple falls. Pt sts she recalls fall this morning and denies hitting head or loc. Pt alert at present. C/o abrasions to elbows.

## 2013-10-15 NOTE — ED Notes (Signed)
Patient transported to CT 

## 2013-11-01 HISTORY — PX: CATARACT EXTRACTION: SUR2

## 2014-03-12 ENCOUNTER — Emergency Department (HOSPITAL_BASED_OUTPATIENT_CLINIC_OR_DEPARTMENT_OTHER): Payer: Medicare Other

## 2014-03-12 ENCOUNTER — Encounter (HOSPITAL_BASED_OUTPATIENT_CLINIC_OR_DEPARTMENT_OTHER): Payer: Self-pay | Admitting: Emergency Medicine

## 2014-03-12 ENCOUNTER — Emergency Department (HOSPITAL_BASED_OUTPATIENT_CLINIC_OR_DEPARTMENT_OTHER)
Admission: EM | Admit: 2014-03-12 | Discharge: 2014-03-12 | Disposition: A | Discharge status: Home / Self Care | Payer: Medicare Other | Attending: Emergency Medicine | Admitting: Emergency Medicine

## 2014-03-12 DIAGNOSIS — R2689 Other abnormalities of gait and mobility: Secondary | ICD-10-CM

## 2014-03-12 DIAGNOSIS — Z859 Personal history of malignant neoplasm, unspecified: Secondary | ICD-10-CM | POA: Insufficient documentation

## 2014-03-12 DIAGNOSIS — M1990 Unspecified osteoarthritis, unspecified site: Secondary | ICD-10-CM | POA: Insufficient documentation

## 2014-03-12 DIAGNOSIS — R29818 Other symptoms and signs involving the nervous system: Secondary | ICD-10-CM | POA: Insufficient documentation

## 2014-03-12 DIAGNOSIS — F0390 Unspecified dementia without behavioral disturbance: Secondary | ICD-10-CM | POA: Insufficient documentation

## 2014-03-12 DIAGNOSIS — N390 Urinary tract infection, site not specified: Secondary | ICD-10-CM | POA: Insufficient documentation

## 2014-03-12 DIAGNOSIS — W19XXXA Unspecified fall, initial encounter: Secondary | ICD-10-CM

## 2014-03-12 DIAGNOSIS — E079 Disorder of thyroid, unspecified: Secondary | ICD-10-CM | POA: Insufficient documentation

## 2014-03-12 DIAGNOSIS — Y939 Activity, unspecified: Secondary | ICD-10-CM | POA: Insufficient documentation

## 2014-03-12 DIAGNOSIS — R296 Repeated falls: Secondary | ICD-10-CM | POA: Insufficient documentation

## 2014-03-12 DIAGNOSIS — Z7982 Long term (current) use of aspirin: Secondary | ICD-10-CM | POA: Insufficient documentation

## 2014-03-12 DIAGNOSIS — IMO0002 Reserved for concepts with insufficient information to code with codable children: Secondary | ICD-10-CM | POA: Insufficient documentation

## 2014-03-12 DIAGNOSIS — I10 Essential (primary) hypertension: Secondary | ICD-10-CM | POA: Insufficient documentation

## 2014-03-12 DIAGNOSIS — Y92009 Unspecified place in unspecified non-institutional (private) residence as the place of occurrence of the external cause: Secondary | ICD-10-CM | POA: Insufficient documentation

## 2014-03-12 LAB — URINALYSIS, ROUTINE W REFLEX MICROSCOPIC
Bilirubin Urine: NEGATIVE
Glucose, UA: NEGATIVE mg/dL
Ketones, ur: NEGATIVE mg/dL
Nitrite: NEGATIVE
PROTEIN: 30 mg/dL — AB
SPECIFIC GRAVITY, URINE: 1.017 (ref 1.005–1.030)
UROBILINOGEN UA: 0.2 mg/dL (ref 0.0–1.0)
pH: 6.5 (ref 5.0–8.0)

## 2014-03-12 LAB — URINE MICROSCOPIC-ADD ON

## 2014-03-12 MED ORDER — CEPHALEXIN 250 MG PO CAPS: 250.0000 mg | ORAL_CAPSULE | Freq: Four times a day (QID) | ORAL | Status: DC

## 2014-03-12 MED ORDER — CEPHALEXIN 250 MG PO CAPS
250.0000 mg | ORAL_CAPSULE | Freq: Once | ORAL | Status: AC
  Administered 2014-03-12: 250 mg via ORAL
  Filled 2014-03-12: qty 1

## 2014-03-12 NOTE — ED Provider Notes (Signed)
CSN: [id number]     Arrival date & time 03/12/14  1152 History   First MD Initiated Contact with Patient 03/12/14 1202     Chief Complaint  Patient presents with  . Fall   Level V caveat dementia  (Consider location/radiation/quality/duration/timing/severity/associated sxs/prior Treatment) HPI This is a 80 year old female who lives alone with a history of dementia. She has a caregiver that comes in daily and family members that live locally. They have a camera has had multiple recent falls. They found her on the floor this morning. Review of the camera video revealed that it occurred at approximately 10:30 last night. It was slightly out of view of the camera however and that they could tell that something happened in the restroom but was not directly seen on the video. I think that she had had to go to the bathroom and was attempting to clean herself up when she fell to the floor and was unable to get back up. Her history correlates with this. She has extremely poor balance and has had multiple recent falls. She complained to her family have pain in her low back after the fall. He really get her up with assistance and she has been able to bear weight and ambulate. Her family and caregivers do not feel that she has had any other recent acute changes in her status. They have not noted any change in her food intake, fever, breathing, or other complaints. Past Medical History  Diagnosis Date  . Hypertension   . Cancer   . Dementia   . Thyroid disease   . OA (osteoarthritis)    Past Surgical History  Procedure Laterality Date  . Cholecystectomy    . Abdominal hysterectomy     No family history on file. History  Substance Use Topics  . Smoking status: Never Smoker   . Smokeless tobacco: Not on file  . Alcohol Use: No   OB History   Grav Para Term Preterm Abortions TAB SAB Ect Mult Living                 Review of Systems  Unable to perform ROS     Allergies  Review of patient's  allergies indicates no known allergies.  Home Medications   Prior to Admission medications   Medication Sig Start Date End Date Taking? Authorizing Provider  aspirin 81 MG tablet Take 81 mg by mouth daily.    Historical Provider, MD  Levothyroxine Sodium (LEVOTHROID PO) Take by mouth.    Historical Provider, MD  Polyethyl Glycol-Propyl Glycol (SYSTANE OP) Apply to eye.    Historical Provider, MD  RAMIPRIL PO Take by mouth.    Historical Provider, MD  Sertraline HCl (ZOLOFT PO) Take by mouth.    Historical Provider, MD   BP 138/68  Pulse 67  Temp(Src) 98 F (36.7 C) (Oral)  Resp 19  SpO2 99% Physical Exam  Nursing note and vitals reviewed. Constitutional: She is oriented to person, place, and time. She appears well-developed and well-nourished.  HENT:  Head: Normocephalic and atraumatic.  Right Ear: External ear normal.  Left Ear: External ear normal.  Nose: Nose normal.  Mouth/Throat: Oropharynx is clear and moist.  Eyes: Conjunctivae and EOM are normal. Pupils are equal, round, and reactive to light.  Neck: Normal range of motion. Neck supple.  Cardiovascular: Normal rate, regular rhythm, normal heart sounds and intact distal pulses.   Pulmonary/Chest: Effort normal and breath sounds normal.  Abdominal: Soft. Bowel sounds are normal.  Musculoskeletal:  Mild diffuse tenderness to palpation throughout the mid to low back and mild tenderness palpation over the right superior iliac crest. No visible signs of trauma seen  Neurological: She is alert and oriented to person, place, and time. She has normal reflexes. No cranial nerve deficit. Coordination abnormal.  Patient with poor balance when ambulated  Skin: Skin is warm and dry.  Psychiatric: She has a normal mood and affect.    ED Course  Procedures (including critical care time) Labs Review Labs Reviewed  URINALYSIS, ROUTINE W REFLEX MICROSCOPIC    Imaging Review Dg Lumbar Spine Complete  03/12/2014   CLINICAL DATA:   Pain post trauma  EXAM: LUMBAR SPINE - COMPLETE 4+ VIEW  COMPARISON:  None.  FINDINGS: Frontal, lateral, spot lumbosacral lateral, and bilateral oblique views were obtained. There are 5 non-rib-bearing lumbar type vertebral bodies. There is lumbar levoscoliosis. There is no fracture or spondylolisthesis. There is mild disc space narrowing at L4-5 and L5-S1. There is facet osteoarthritic change at L3-4, L4-5, and L5-S1 bilaterally.  IMPRESSION: Relatively mild osteoarthritic change. Mild scoliosis. No fracture or spondylolisthesis.   Electronically Signed   By: Lowella Grip M.D.   On: 03/12/2014 13:33   Dg Pelvis 1-2 Views  03/12/2014   CLINICAL DATA:  Pain post trauma  EXAM: PELVIS - 1-2 VIEW  COMPARISON:  None.  FINDINGS: There is no evidence of pelvic fracture or dislocation. There is mild symmetric narrowing of both hip joints. No erosive change. Bones appear somewhat osteoporotic.  IMPRESSION: Mild osteoarthritic change in both hip joints. No fracture or dislocation. There is a degree of osteoporotic change.   Electronically Signed   By: Lowella Grip M.D.   On: 03/12/2014 13:34   Ct Head Wo Contrast  03/12/2014   CLINICAL DATA:  Status post fall ; history of dementia  EXAM: CT HEAD WITHOUT CONTRAST  TECHNIQUE: Contiguous axial images were obtained from the base of the skull through the vertex without intravenous contrast.  COMPARISON:  CT HEAD W/O CM dated 10/15/2013  FINDINGS: There is mild diffuse cerebral and cerebellar atrophy with compensatory ventriculomegaly. These findings are stable. There is no shift of the midline. There is no evidence of an acute intracranial hemorrhage or of an evolving ischemic infarction. There is decreased density in the deep white matter of both cerebral hemispheres consistent with chronic small vessel ischemic type change. There is basal ganglia calcification on the right. The cerebellum and brainstem are normal in density.  At bone window settings the observed  portions of the paranasal sinuses cysts and mastoid air cells are clear. There is no evidence of an acute skull fracture. A small amount of soft tissue swelling is present over the high left parietal bone consistent with a cephalohematoma.  IMPRESSION: 1. There is no evidence of an acute intracranial hemorrhage nor other acute intracranial abnormality. There are chronic atrophic changes with compensatory ventriculomegaly. White matter density changes are present and stable consistent with chronic small vessel ischemia. 2. There is no evidence of an acute skull fracture. There is a small cephalohematoma over the high left parietal bone.   Electronically Signed   By: David  Martinique   On: 03/12/2014 13:13     EKG Interpretation None      MDM   Final diagnoses:  Fall  UTI (lower urinary tract infection)  Dementia  Poor balance       Shaune Pollack, MD 03/12/14 1442

## 2014-03-12 NOTE — Discharge Instructions (Signed)
Urinary Tract Infection Urinary tract infections (UTIs) can develop anywhere along your urinary tract. Your urinary tract is your body's drainage system for removing wastes and extra water. Your urinary tract includes two kidneys, two ureters, a bladder, and a urethra. Your kidneys are a pair of bean-shaped organs. Each kidney is about the size of your fist. They are located below your ribs, one on each side of your spine. CAUSES Infections are caused by microbes, which are microscopic organisms, including fungi, viruses, and bacteria. These organisms are so small that they can only be seen through a microscope. Bacteria are the microbes that most commonly cause UTIs. SYMPTOMS  Symptoms of UTIs may vary by age and gender of the patient and by the location of the infection. Symptoms in young women typically include a frequent and intense urge to urinate and a painful, burning feeling in the bladder or urethra during urination. Older women and men are more likely to be tired, shaky, and weak and have muscle aches and abdominal pain. A fever may mean the infection is in your kidneys. Other symptoms of a kidney infection include pain in your back or sides below the ribs, nausea, and vomiting. DIAGNOSIS To diagnose a UTI, your caregiver will ask you about your symptoms. Your caregiver also will ask to provide a urine sample. The urine sample will be tested for bacteria and white blood cells. White blood cells are made by your body to help fight infection. TREATMENT  Typically, UTIs can be treated with medication. Because most UTIs are caused by a bacterial infection, they usually can be treated with the use of antibiotics. The choice of antibiotic and length of treatment depend on your symptoms and the type of bacteria causing your infection. HOME CARE INSTRUCTIONS  If you were prescribed antibiotics, take them exactly as your caregiver instructs you. Finish the medication even if you feel better after you  have only taken some of the medication.  Drink enough water and fluids to keep your urine clear or pale yellow.  Avoid caffeine, tea, and carbonated beverages. They tend to irritate your bladder.  Empty your bladder often. Avoid holding urine for long periods of time.  Empty your bladder before and after sexual intercourse.  After a bowel movement, women should cleanse from front to back. Use each tissue only once. SEEK MEDICAL CARE IF:   You have back pain.  You develop a fever.  Your symptoms do not begin to resolve within 3 days. SEEK IMMEDIATE MEDICAL CARE IF:   You have severe back pain or lower abdominal pain.  You develop chills.  You have nausea or vomiting.  You have continued burning or discomfort with urination. MAKE SURE YOU:   Understand these instructions.  Will watch your condition.  Will get help right away if you are not doing well or get worse. Document Released: 07/28/2005 Document Revised: 04/18/2012 Document Reviewed: 11/26/2011 [facility] Patient Information 2014 [place]. Fall Prevention and Home Safety Falls cause injuries and can affect all age groups. It is possible to prevent falls.  HOW TO PREVENT FALLS  Wear shoes with rubber soles that do not have an opening for your toes.  Keep the inside and outside of your house well lit.  Use night lights throughout your home.  Remove clutter from floors.  Clean up floor spills.  Remove throw rugs or fasten them to the floor with carpet tape.  Do not place electrical cords across pathways.  Put grab bars by your tub,  shower, and toilet. Do not use towel bars as grab bars.  Put handrails on both sides of the stairway. Fix loose handrails.  Do not climb on stools or stepladders, if possible.  Do not wax your floors.  Repair uneven or unsafe sidewalks, walkways, or stairs.  Keep items you use a lot within reach.  Be aware of pets.  Keep emergency numbers next to the  telephone.  Put smoke detectors in your home and near bedrooms. Ask your doctor what other things you can do to prevent falls. Document Released: 08/14/2009 Document Revised: 04/18/2012 Document Reviewed: 01/18/2012 [facility] Patient Information 2014 [place], [place].

## 2014-03-12 NOTE — ED Notes (Signed)
Per family member, [name] fell last night in her bathroom and they found her on the floor this morning when visiting. Patient uses cane to ambulate and per family she has had numerous falls. Patient states that she has no pain, but family states that patient c/o lower back and upper legs pain this morning.

## 2014-03-15 LAB — URINE CULTURE: Colony Count: 85000

## 2014-03-16 ENCOUNTER — Telehealth (HOSPITAL_BASED_OUTPATIENT_CLINIC_OR_DEPARTMENT_OTHER): Payer: Self-pay | Admitting: Emergency Medicine

## 2014-03-16 NOTE — Telephone Encounter (Signed)
Post ED Visit - Positive Culture Follow-up  Culture report reviewed by antimicrobial stewardship pharmacist: []  Wes [place], Pharm.D., BCPS []  Heide Guile, Pharm.D., BCPS []  Alycia Rossetti, Pharm.D., BCPS []  [place], Pharm.D., BCPS, AAHIVP []  Legrand Como, Pharm.D., BCPS, AAHIVP []  Juliene Pina, Pharm.D. [x]  Salome Arnt, Pharm.D  Positive urine culture Treated with Keflex, organism sensitive to the same and no further patient follow-up is required at this time.  Lolah Coghlan 03/16/2014, 4:07 PM

## 2014-03-27 ENCOUNTER — Encounter: Payer: Self-pay | Admitting: Diagnostic Neuroimaging

## 2014-03-27 ENCOUNTER — Ambulatory Visit (INDEPENDENT_AMBULATORY_CARE_PROVIDER_SITE_OTHER): Payer: Medicare Other | Admitting: Diagnostic Neuroimaging

## 2014-03-27 VITALS — BP 131/62 | HR 68 | Temp 97.1°F | Ht 61.0 in | Wt 160.0 lb

## 2014-03-27 DIAGNOSIS — F03918 Unspecified dementia, unspecified severity, with other behavioral disturbance: Secondary | ICD-10-CM

## 2014-03-27 DIAGNOSIS — F03B18 Unspecified dementia, moderate, with other behavioral disturbance: Secondary | ICD-10-CM

## 2014-03-27 DIAGNOSIS — F0391 Unspecified dementia with behavioral disturbance: Secondary | ICD-10-CM

## 2014-03-27 MED ORDER — MEMANTINE HCL ER 7 & 14 & 21 &28 MG PO CP24: 1.0000 | ORAL_CAPSULE | Freq: Every day | ORAL | Status: AC

## 2014-03-27 MED ORDER — MEMANTINE HCL ER 28 MG PO CP24: 1.0000 | ORAL_CAPSULE | Freq: Every day | ORAL | Status: AC

## 2014-03-27 NOTE — Patient Instructions (Signed)
Start namenda XR.

## 2014-03-27 NOTE — Progress Notes (Signed)
[facility]  PATIENT: [name] DOB: 08-21-1933  REFERRING CLINICIAN: [place] HISTORY FROM: patient  REASON FOR VISIT: new consult (existing patient)   HISTORICAL  CHIEF COMPLAINT:  Chief Complaint  Patient presents with  . Memory Loss    HISTORY OF PRESENT ILLNESS:   UPDATE 03/27/14: Patient presents for followup of memory loss. Since last visit she is transition to independent living facility, with a caregiver who sees her 7 days per week. Patient's caregiver's name is Acie Fredrickson, whom I spoke with as well. Patient needs help taking a shower, getting dressed, getting ready for the day. Patient no longer drives. Her daughters take care of the bills. Patient needs help for many of her activities of daily living. Patient having some ongoing behavior issues, paranoia, confusion. Since last visit patient also had diagnosis of melanoma on the scalp, requiring surgical resection and skin graft. Patient also had cataract surgeries.  PRIOR HPI (07/15/11): 80 year old right-handed female with history of hypertension, hypercholesterolemia, hypothyroidism, here for evaluation of memory problems. According to the patient, she does not think she has any particular problem with her cognitive abilities, memory or personality.   Patient's daughter Vernard Gambles is here with the patient today and reports some problems with short-term memory, balance and walking. Patient's other daughter Ronnie Derby) wrote me a letter indicating that she has noticed short-term memory problems, personality change, frequent falls, reduced pain sensitivity, and episodes of disorientation over the past 1-2 years.  These have also been noted by the patient's friends, family, and staff at her assisted living community.    Other symptoms noted by the family include reports of delusions, agitation, depression, anxiety, apathy, disinhibition, irritability, change in appetite and sleep disturbance.   Patient is independent in her activities of daily living such as bathing, dressing, toileting and feeding. She has difficulty with shopping, meal preparation, housekeeping, medications and finances.  REVIEW OF SYSTEMS: Full 14 system review of systems performed and notable only for memory loss confusion slurred speech depression incontinence ringing in ears weight gain difficulty speaking.   ALLERGIES: No Known Allergies  HOME MEDICATIONS: Outpatient Prescriptions Prior to Visit  Medication Sig Dispense Refill  . aspirin 81 MG tablet Take 81 mg by mouth daily.      . Levothyroxine Sodium (LEVOTHROID PO) Take by mouth.      Vladimir Faster Glycol-Propyl Glycol (SYSTANE OP) Apply to eye.      Marland Kitchen RAMIPRIL PO Take by mouth.      . Sertraline HCl (ZOLOFT PO) Take by mouth.      . cephALEXin (KEFLEX) 250 MG capsule Take 1 capsule (250 mg total) by mouth 4 (four) times daily.  28 capsule  0   No facility-administered medications prior to visit.    PAST MEDICAL HISTORY: Past Medical History  Diagnosis Date  . Hypertension   . Cancer   . Dementia   . Thyroid disease   . OA (osteoarthritis)     PAST SURGICAL HISTORY: Past Surgical History  Procedure Laterality Date  . Cholecystectomy    . Abdominal hysterectomy    . Cataract extraction  2015    FAMILY HISTORY: Family History  Problem Relation Age of Onset  . Dementia Mother     SOCIAL HISTORY:  History   Social History  . Marital Status: Widowed    Spouse Name: N/A    Number of Children: 6  . Years of Education: College   Occupational History  .     Social History  Main Topics  . Smoking status: Never Smoker   . Smokeless tobacco: Never Used  . Alcohol Use: No  . Drug Use: No  . Sexual Activity: Not on file   Other Topics Concern  . Not on file   Social History Narrative   Patient lives in an independent living facility.   Caffeine Use: 3-4 glasses daily     PHYSICAL EXAM  Filed Vitals:   03/27/14 1102    BP: 131/62  Pulse: 68  Temp: 97.1 F (36.2 C)  TempSrc: Oral  Height: 5\' 1"  (1.549 m)  Weight: 160 lb (72.576 kg)    Not recorded    Body mass index is 30.25 kg/(m^2).  GENERAL EXAM: Patient is in no distress; well developed, nourished and groomed; neck is supple  CARDIOVASCULAR: Regular rate and rhythm, no murmurs, no carotid bruits  NEUROLOGIC: MENTAL STATUS: awake, alert, oriented to person, place and time, recent and remote memory intact, normal attention and concentration, language fluent, comprehension intact, naming intact, fund of knowledge appropriate; EXCEPT FOR MMSE 16/30. AFT 4. POSITIVE SNOUT, MYERSON'S AND PALMOMENTAL REFLEXES.   CRANIAL NERVE: pupils equal and reactive to light, visual fields full to confrontation, extraocular muscles intact, no nystagmus, facial sensation and strength symmetric, hearing intact, palate elevates symmetrically, uvula midline, shoulder shrug symmetric, tongue midline. MOTOR: COGWHEELING IN BUE. BRADYKINESIA IN BUE AND BLE. Normal bulk. Full strength in the BUE, BLE SENSORY: normal and symmetric to light touch, pinprick, temperature, vibration COORDINATION: finger-nose-finger, fine finger movements normal REFLEXES: deep tendon reflexes present and symmetric GAIT/STATION: SHORT STEPS; CAUTIOUS GAIT; UNSTEADY WITH GAIT.  UNABLE TO TOE, HEEL OR TANDEM.  SLOW WITH GETTING UP FROM CHAIR.    DIAGNOSTIC DATA (LABS, IMAGING, TESTING) - I reviewed patient records, labs, notes, testing and imaging myself where available.  Lab Results  Component Value Date   WBC 8.4 10/15/2013   HGB 13.3 10/15/2013   HCT 39.8 10/15/2013   MCV 91.5 10/15/2013   PLT 237 10/15/2013      Component Value Date/Time   NA 141 10/15/2013 1325   K 3.8 10/15/2013 1325   CL 106 10/15/2013 1325   CO2 21 10/15/2013 1325   GLUCOSE 85 10/15/2013 1325   BUN 23 10/15/2013 1325   CREATININE 0.70 10/15/2013 1325   CALCIUM 9.9 10/15/2013 1325   PROT 7.1 10/15/2013  1325   ALBUMIN 4.3 10/15/2013 1325   AST 28 10/15/2013 1325   ALT 27 10/15/2013 1325   ALKPHOS 58 10/15/2013 1325   BILITOT 0.5 10/15/2013 1325   GFRNONAA 81* 10/15/2013 1325   GFRAA >90 10/15/2013 1325   No results found for this basename: CHOL, HDL, LDLCALC, LDLDIRECT, TRIG, CHOLHDL   No results found for this basename: HGBA1C   No results found for this basename: VITAMINB12   No results found for this basename: TSH    I reviewed images myself and agree with interpretation. There is severe mesial temporal atrophy. -VRP  03/12/14 CT head 1. There is no evidence of an acute intracranial hemorrhage nor  other acute intracranial abnormality. There are chronic atrophic  changes with compensatory ventriculomegaly. White matter density  changes are present and stable consistent with chronic small vessel  ischemia.  2. There is no evidence of an acute skull fracture. There is a small  cephalohematoma over the high left parietal bone.    ASSESSMENT AND PLAN  80 y.o. female here with progressive short-term memory loss, balance and gait difficulty.  Suspect underlying neurodegenerative condition, such as  dementia with lewy bodies or alzheimer's dementia. Agree with her transitioning to live with her daughter in [place] over next 2-3 months.  PLAN: - start namenda XR (already having issues with upset GI system, so will avoid donepezil or exelon)   Meds ordered this encounter  Medications  . Memantine HCl ER (NAMENDA XR TITRATION PACK) 7 & 14 & 21 &28 MG CP24    Sig: Take 1 capsule by mouth daily.    Dispense:  28 capsule    Refill:  0  . Memantine HCl ER (NAMENDA XR) 28 MG CP24    Sig: Take 28 mg by mouth daily.    Dispense:  30 capsule    Refill:  12   Return in about 6 months (around 09/27/2014).    Penni Bombard, MD 3/73/4287, 68:11 PM Certified in Neurology, Neurophysiology and Neuroimaging  [facility] [address], [place],  [place] [address] [phone]

## 2014-10-01 ENCOUNTER — Ambulatory Visit: Payer: TRICARE For Life (TFL) | Admitting: Diagnostic Neuroimaging

## 2015-04-23 IMAGING — CR DG PELVIS 1-2V
1 series · 1 of 1 positions shown · non-contrast
Comparison: None.

CLINICAL DATA: Pain post trauma

EXAM:
PELVIS - 1-2 VIEW

[t pelvis a.p.]
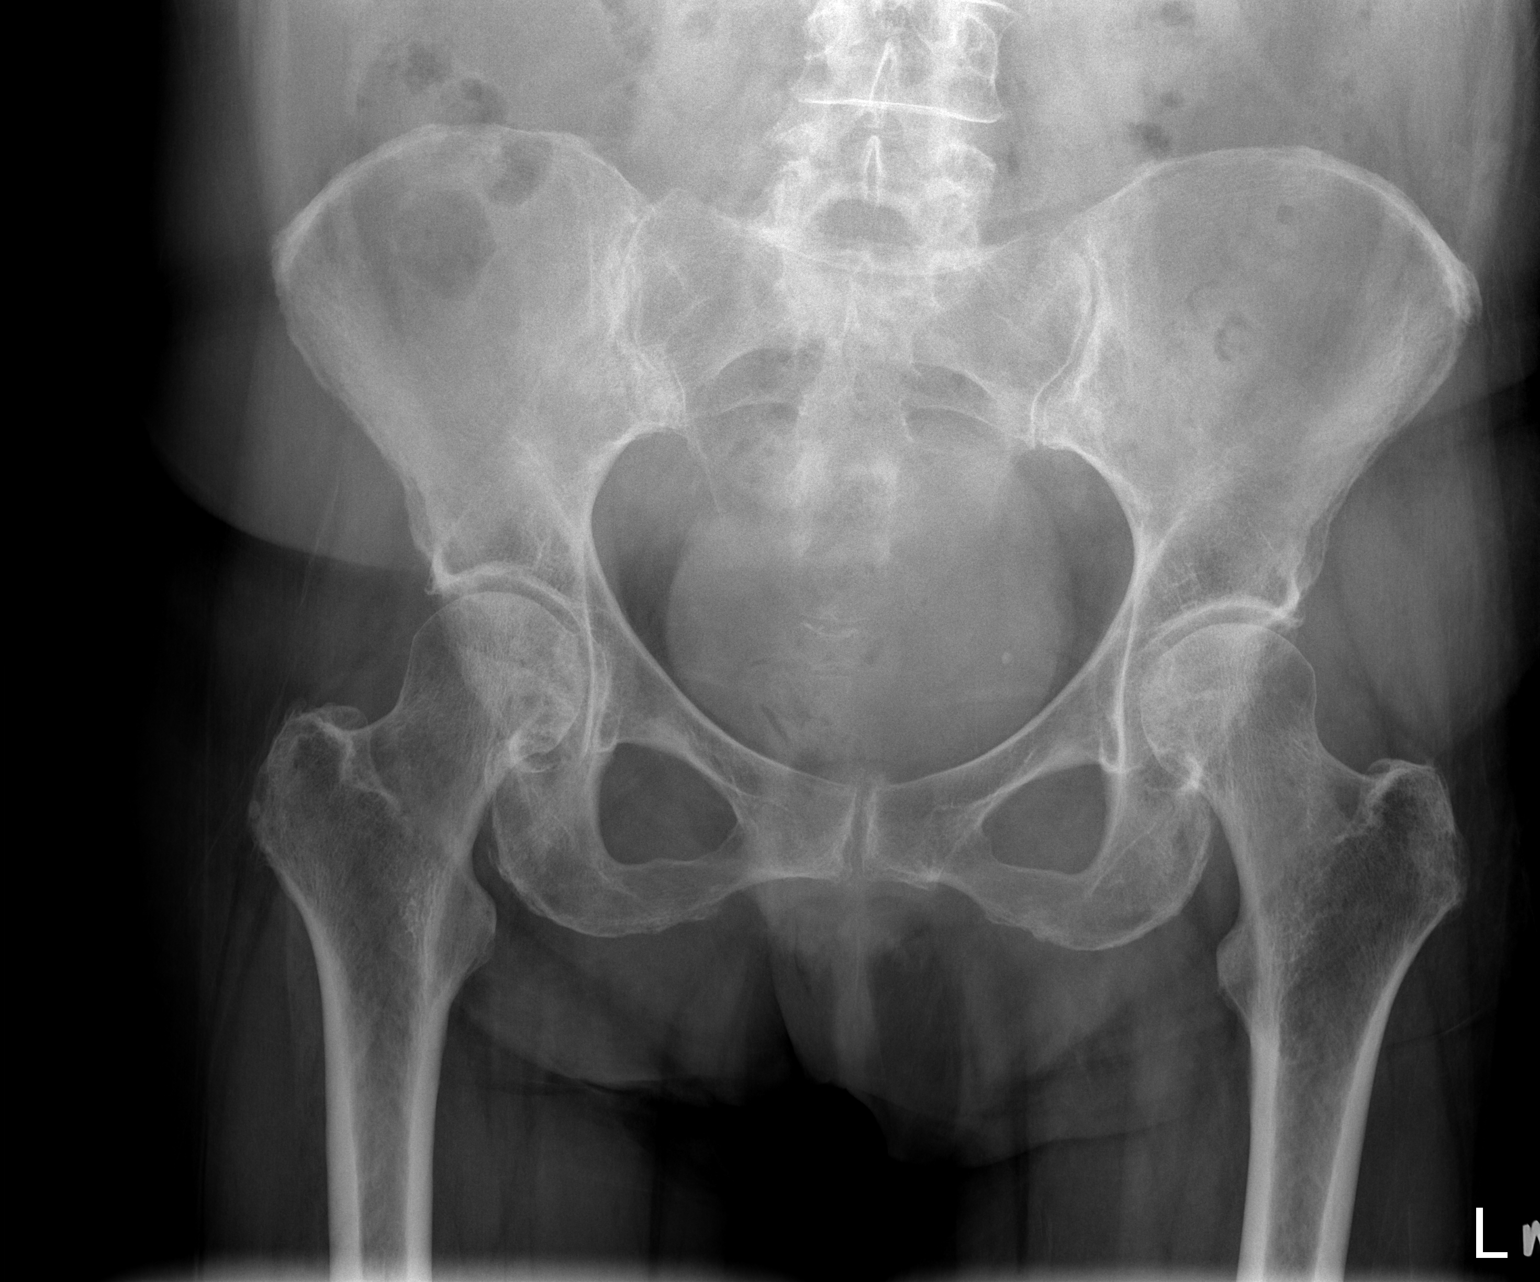

[1 of 1 positions shown; findings below may reference images not displayed]

FINDINGS: There is no evidence of pelvic fracture or dislocation. There is
mild symmetric narrowing of both hip joints. No erosive change.
Bones appear somewhat osteoporotic.
IMPRESSION: Mild osteoarthritic change in both hip joints. No fracture or
dislocation. There is a degree of osteoporotic change.

## 2016-09-01 DEATH — deceased
